# Patient Record
Sex: Male | Born: 1990 | Race: Black or African American | Hispanic: No | Marital: Married | Smoking: Former smoker
Health system: Southern US, Community
[De-identification: ages and names within clinical notes are randomized; demographics above are authoritative.]

## PROBLEM LIST (undated history)

## (undated) DIAGNOSIS — B009 Herpesviral infection, unspecified: Secondary | ICD-10-CM

## (undated) DIAGNOSIS — J45909 Unspecified asthma, uncomplicated: Secondary | ICD-10-CM

## (undated) DIAGNOSIS — G43909 Migraine, unspecified, not intractable, without status migrainosus: Secondary | ICD-10-CM

## (undated) HISTORY — PX: UMBILICAL HERNIA REPAIR: SHX196

## (undated) HISTORY — DX: Migraine, unspecified, not intractable, without status migrainosus: G43.909

## (undated) HISTORY — DX: Herpesviral infection, unspecified: B00.9

---

## 2016-09-19 ENCOUNTER — Emergency Department (HOSPITAL_COMMUNITY): Payer: Self-pay

## 2016-09-19 ENCOUNTER — Emergency Department (HOSPITAL_COMMUNITY)
Admission: EM | Admit: 2016-09-19 | Discharge: 2016-09-19 | Disposition: A | Discharge status: Home / Self Care | Payer: Self-pay | Attending: Emergency Medicine | Admitting: Emergency Medicine

## 2016-09-19 ENCOUNTER — Encounter (HOSPITAL_COMMUNITY): Payer: Self-pay | Admitting: *Deleted

## 2016-09-19 DIAGNOSIS — Z87891 Personal history of nicotine dependence: Secondary | ICD-10-CM | POA: Insufficient documentation

## 2016-09-19 DIAGNOSIS — S92354A Nondisplaced fracture of fifth metatarsal bone, right foot, initial encounter for closed fracture: Secondary | ICD-10-CM | POA: Insufficient documentation

## 2016-09-19 DIAGNOSIS — Y9367 Activity, basketball: Secondary | ICD-10-CM | POA: Insufficient documentation

## 2016-09-19 DIAGNOSIS — Y998 Other external cause status: Secondary | ICD-10-CM | POA: Insufficient documentation

## 2016-09-19 DIAGNOSIS — W1839XA Other fall on same level, initial encounter: Secondary | ICD-10-CM | POA: Insufficient documentation

## 2016-09-19 DIAGNOSIS — Y929 Unspecified place or not applicable: Secondary | ICD-10-CM | POA: Insufficient documentation

## 2016-09-19 MED ORDER — HYDROCODONE-ACETAMINOPHEN 5-325 MG PO TABS
2.0000 | ORAL_TABLET | Freq: Once | ORAL | Status: AC
  Administered 2016-09-19: 2 via ORAL
  Filled 2016-09-19: qty 2

## 2016-09-19 MED ORDER — HYDROCODONE-ACETAMINOPHEN 5-325 MG PO TABS: 1.0000 | ORAL_TABLET | Freq: Four times a day (QID) | ORAL | 0 refills | Status: DC | PRN

## 2016-09-19 NOTE — ED Triage Notes (Signed)
Patient states he was playing basketball earlier yest and rolled his right ankle,c/o pain in ankle worse when walking.

## 2016-09-19 NOTE — ED Provider Notes (Signed)
TIME SEEN: 6:00 AM  CHIEF COMPLAINT: Right foot and ankle pain  HPI: Patient is a 26 year old male with no significant past medical history who presents to the emergency department with right foot and ankle pain. Reports yesterday evening he was playing basketball and he stepped on another players foot rolling his ankle. Complaining of right lateral foot pain. Has been able to "hop around" but not able to bear weight. Denies numbness, tingling or focal weakness. Did not fall or hit his head. No neck or back pain.  ROS: See HPI Constitutional: no fever  Eyes: no drainage  ENT: no runny nose   Cardiovascular:  no chest pain  Resp: no SOB  GI: no vomiting GU: no dysuria Integumentary: no rash  Allergy: no hives  Musculoskeletal: no leg swelling  Neurological: no slurred speech ROS otherwise negative  PAST MEDICAL HISTORY/PAST SURGICAL HISTORY:  History reviewed. No pertinent past medical history.  MEDICATIONS:  Prior to Admission medications   Not on File    ALLERGIES:  Allergies not on file  SOCIAL HISTORY:  Social History  Substance Use Topics  . Smoking status: Former Games developer  . Smokeless tobacco: Never Used  . Alcohol use Yes    FAMILY HISTORY: History reviewed. No pertinent family history.  EXAM: BP 131/78 (BP Location: Left Arm)   Pulse 68   Temp 97.9 F (36.6 C) (Oral)   Resp 22   Ht 5\' 6"  (1.676 m)   Wt 155 lb (70.3 kg)   SpO2 99%   BMI 25.02 kg/m  CONSTITUTIONAL: Alert and oriented and responds appropriately to questions. Well-appearing; well-nourished HEAD: Normocephalic, Atraumatic EYES: Conjunctivae clear, PERRL, EOMI ENT: normal nose; no rhinorrhea; moist mucous membranes NECK: Supple, no meningismus, no nuchal rigidity, no LAD  CARD: RRR; S1 and S2 appreciated; no murmurs, no clicks, no rubs, no gallops RESP: Normal chest excursion without splinting or tachypnea; breath sounds clear and equal bilaterally; no wheezes, no rhonchi, no rales, no  hypoxia or respiratory distress, speaking full sentences ABD/GI: Normal bowel sounds; non-distended; soft, non-tender, no rebound, no guarding, no peritoneal signs, no hepatosplenomegaly BACK:  The back appears normal and is non-tender to palpation, there is no CVA tenderness EXT: Tender to palpation over the right lateral foot with associated ecchymosis and swelling. No ligamentous laxity of the right ankle. He does have some very minimal tenderness over the right lateral malleolus. Compartments are soft. No tenderness at the proximal fibular head on the right side. 2+ DP pulses bilaterally. Sensation to light touch intact diffusely throughout the right foot. Normal ROM in all joints; otherwise extremities are non-tender to palpation; no edema; normal capillary refill; no cyanosis, no calf tenderness or swelling    SKIN: Normal color for age and race; warm; no rash NEURO: Moves all extremities equally, sensation to light touch intact diffusely, cranial nerves II through XII intact, normal speech PSYCH: The patient's mood and manner are appropriate. Grooming and personal hygiene are appropriate.  MEDICAL DECISION MAKING: Patient here with mechanical fall. Has a right fifth metatarsal nondisplaced fracture. We will place a short leg splint and keep him crutches to keep him nonweightbearing. We'll discharge with prescription for Vicodin, instructions for splint care, ice, elevation and orthopedic follow-up. No other injury on exam. Neurovascularly intact distally. He verbalizes understanding and is comfortable with this plan.    At this time, I do not feel there is any life-threatening condition present. I have reviewed and discussed all results (EKG, imaging, lab, urine as appropriate) and  exam findings with patient/family. I have reviewed nursing notes and appropriate previous records.  I feel the patient is safe to be discharged home without further emergent workup and can continue workup as an  outpatient as needed. Discussed usual and customary return precautions. Patient/family verbalize understanding and are comfortable with this plan.  Outpatient follow-up has been provided. All questions have been answered.      Layla MawKristen N Zedekiah Hinderman, DO 09/19/16 [phone]

## 2017-07-23 ENCOUNTER — Other Ambulatory Visit: Payer: Self-pay

## 2017-07-23 ENCOUNTER — Emergency Department (HOSPITAL_COMMUNITY)
Admission: EM | Admit: 2017-07-23 | Discharge: 2017-07-23 | Disposition: A | Discharge status: Home / Self Care | Payer: No Typology Code available for payment source | Attending: Emergency Medicine | Admitting: Emergency Medicine

## 2017-07-23 ENCOUNTER — Encounter (HOSPITAL_COMMUNITY): Payer: Self-pay | Admitting: *Deleted

## 2017-07-23 DIAGNOSIS — S199XXA Unspecified injury of neck, initial encounter: Secondary | ICD-10-CM | POA: Diagnosis present

## 2017-07-23 DIAGNOSIS — M542 Cervicalgia: Secondary | ICD-10-CM | POA: Diagnosis not present

## 2017-07-23 DIAGNOSIS — Y999 Unspecified external cause status: Secondary | ICD-10-CM | POA: Diagnosis not present

## 2017-07-23 DIAGNOSIS — M62838 Other muscle spasm: Secondary | ICD-10-CM | POA: Diagnosis not present

## 2017-07-23 DIAGNOSIS — J45909 Unspecified asthma, uncomplicated: Secondary | ICD-10-CM | POA: Insufficient documentation

## 2017-07-23 DIAGNOSIS — Z87891 Personal history of nicotine dependence: Secondary | ICD-10-CM | POA: Insufficient documentation

## 2017-07-23 DIAGNOSIS — X500XXA Overexertion from strenuous movement or load, initial encounter: Secondary | ICD-10-CM | POA: Insufficient documentation

## 2017-07-23 DIAGNOSIS — Y93B9 Activity, other involving muscle strengthening exercises: Secondary | ICD-10-CM | POA: Insufficient documentation

## 2017-07-23 DIAGNOSIS — Y9239 Other specified sports and athletic area as the place of occurrence of the external cause: Secondary | ICD-10-CM | POA: Insufficient documentation

## 2017-07-23 HISTORY — DX: Unspecified asthma, uncomplicated: J45.909

## 2017-07-23 MED ORDER — METHOCARBAMOL 750 MG PO TABS
750.0000 mg | ORAL_TABLET | Freq: Three times a day (TID) | ORAL | 0 refills | Status: DC | PRN
Start: 2017-07-23 — End: 2020-10-19

## 2017-07-23 NOTE — Discharge Instructions (Signed)
Please take Ibuprofen (Advil, motrin) and Tylenol (acetaminophen) to relieve your pain.  You may take up to 600 MG (3 pills) of normal strength ibuprofen every 8 hours as needed.  In between doses of ibuprofen you make take tylenol, up to 1,000 mg (two extra strength pills).  Do not take more than 3,000 mg tylenol in a 24 hour period.  Please check all medication labels as many medications such as pain and cold medications may contain tylenol.  Do not drink alcohol while taking these medications.  Do not take other NSAID'S while taking ibuprofen (such as aleve or naproxen).  Please take ibuprofen with food to decrease stomach upset. ° °The best way to get rid of muscle pain is by taking NSAIDS, using heat, massage therapy, and gentle stretching/range of motion exercises. ° °You are being prescribed a medication which may make you sleepy. For 24 hours after one dose please do not drive, operate heavy machinery, care for a small child with out another adult present, or perform any activities that may cause harm to you or someone else if you were to fall asleep or be impaired.  ° °

## 2017-07-23 NOTE — ED Triage Notes (Signed)
thwe pt is c/o neck pain for 3 days he thinks he  May have strined it at the gym  Where he goes 4-5 times a week

## 2017-07-23 NOTE — ED Provider Notes (Signed)
[facility] EMERGENCY DEPARTMENT Provider Note   CSN: [id number] Arrival date & time: 07/23/17  1454     History   Chief Complaint Chief Complaint  Patient presents with  . Neck Injury    HPI Douglas Ellis is a 27 y.o. male who presents today for evaluation of right-sided neck pain for approximately 3 days.  He reports that he goes to the gym 4-5 times a week and, while he does not recall doing any new exercises, suspects that he may have strained the muscle at the gym.  He denies any trauma.  No fevers or chills.  He denies any numbness or tingling.  Pain is made worse with movement, unchanged by ibuprofen, feels cramping in nature. HPI  Past Medical History:  Diagnosis Date  . Asthma     There are no active problems to display for this patient.   History reviewed. No pertinent surgical history.     Home Medications    Prior to Admission medications   Medication Sig Start Date End Date Taking? Authorizing Provider  HYDROcodone-acetaminophen (NORCO/VICODIN) 5-325 MG tablet Take 1-2 tablets by mouth every 6 (six) hours as needed. 09/19/16   Ward, Layla Maw, DO  methocarbamol (ROBAXIN) 750 MG tablet Take 1-2 tablets (750-1,500 mg total) by mouth 3 (three) times daily as needed for muscle spasms. 07/23/17   Cristina Gong, PA-C    Family History No family history on file.  Social History Social History   Tobacco Use  . Smoking status: Former Games developer  . Smokeless tobacco: Never Used  Substance Use Topics  . Alcohol use: Yes  . Drug use: Yes    Types: Marijuana     Allergies   Patient has no known allergies.   Review of Systems Review of Systems  Constitutional: Negative for chills and fever.  Musculoskeletal: Positive for neck pain.       Muscle pain in right posterior shoulder, lateral neck.   Skin: Negative for color change and wound.  Neurological: Negative for weakness, numbness and headaches.     Physical Exam Updated Vital  Signs BP 127/70 (BP Location: Right Arm)   Pulse 60   Temp 98.5 F (36.9 C) (Oral)   Resp 16   Ht 5\' 6"  (1.676 m)   Wt 72.6 kg (160 lb)   SpO2 100%   BMI 25.82 kg/m   Physical Exam  Constitutional: He appears well-developed and well-nourished.  HENT:  Head: Normocephalic and atraumatic.  Cardiovascular: Normal rate, regular rhythm, normal heart sounds and intact distal pulses.  2+ radial pulses bilaterally.  Pulmonary/Chest: Effort normal and breath sounds normal. No respiratory distress.  Musculoskeletal:  5/5 grip strength bilaterally.  No midline C/T-spine tenderness.  There is mild right sided lateral neck paraspinal muscle TTP.  Palpation of trapezius muscle both recreates and exacerbates his pain.    Neurological:  Sensation intact and symmetrical to bilateral upper extremities.  Skin: Skin is warm and dry. He is not diaphoretic.  Psychiatric: He has a normal mood and affect. His behavior is normal.  Nursing note and vitals reviewed.    ED Treatments / Results  Labs (all labs ordered are listed, but only abnormal results are displayed) Labs Reviewed - No data to display  EKG  EKG Interpretation None       Radiology No results found.  Procedures Procedures (including critical care time)  Medications Ordered in ED Medications - No data to display   Initial Impression / Assessment and Plan /  ED Course  I have reviewed the triage vital signs and the nursing notes.  Pertinent labs & imaging results that were available during my care of the patient were reviewed by me and considered in my medical decision making (see chart for details).    [facility]enberg Presents with right-sided neck paraspinal tenderness and right-sided superior posterior shoulder tenderness consistent with trapezius muscle spasm.  He does not have any history of trauma, and palpation along trapezius muscle both re-creates and exacerbates his reported pain.  Patient given instructions for  OTC pain medication, muscle relaxer.  He was advised that the prescribed medication may make him sleepy or drowsy and he should not drive, operate heavy machinery, or perform other potentially dangerous tasks while taking this medicine.  Patient advised to follow up with PCP if symptoms persist for longer than one week.  Patient was given the option to ask questions, all of which were answered to the best of my ability.  Patient is agreeable for discharge.     Final Clinical Impressions(s) / ED Diagnoses   Final diagnoses:  Trapezius muscle spasm  Neck pain    ED Discharge Orders        Ordered    methocarbamol (ROBAXIN) 750 MG tablet  3 times daily PRN     07/23/17 2029       Cristina GongHammond, Emmalia Heyboer W, PA-C 07/24/17 0057    Nira Connardama, Pedro Eduardo, MD 07/27/17 0157

## 2018-08-19 IMAGING — CR DG ANKLE COMPLETE 3+V*R*
3 series · 3 of 3 positions shown · non-contrast
Comparison: None.

CLINICAL DATA: Twisted ankle yesterday while playing basketball.
Predominately lateral foot and to lesser extent ankle pain.

EXAM:
RIGHT ANKLE - COMPLETE 3+ VIEW; RIGHT FOOT COMPLETE - 3+ VIEW

[ankle ap]
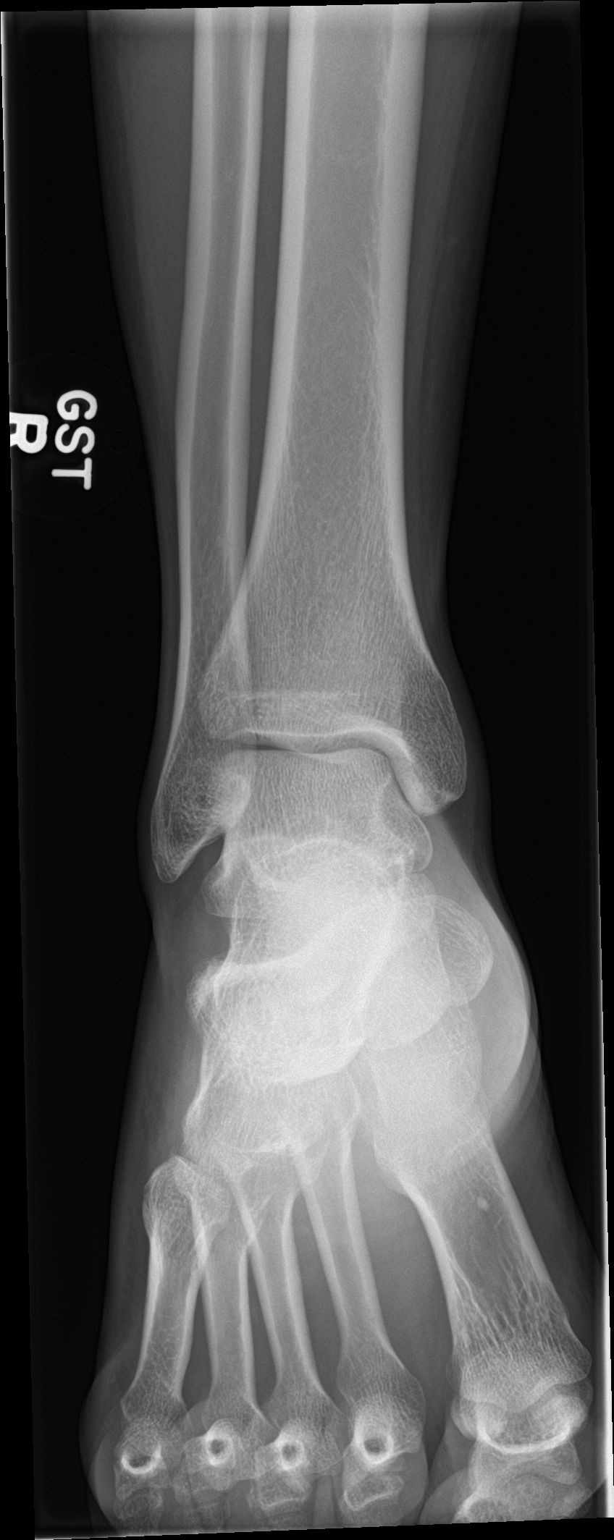

[ankle obl]
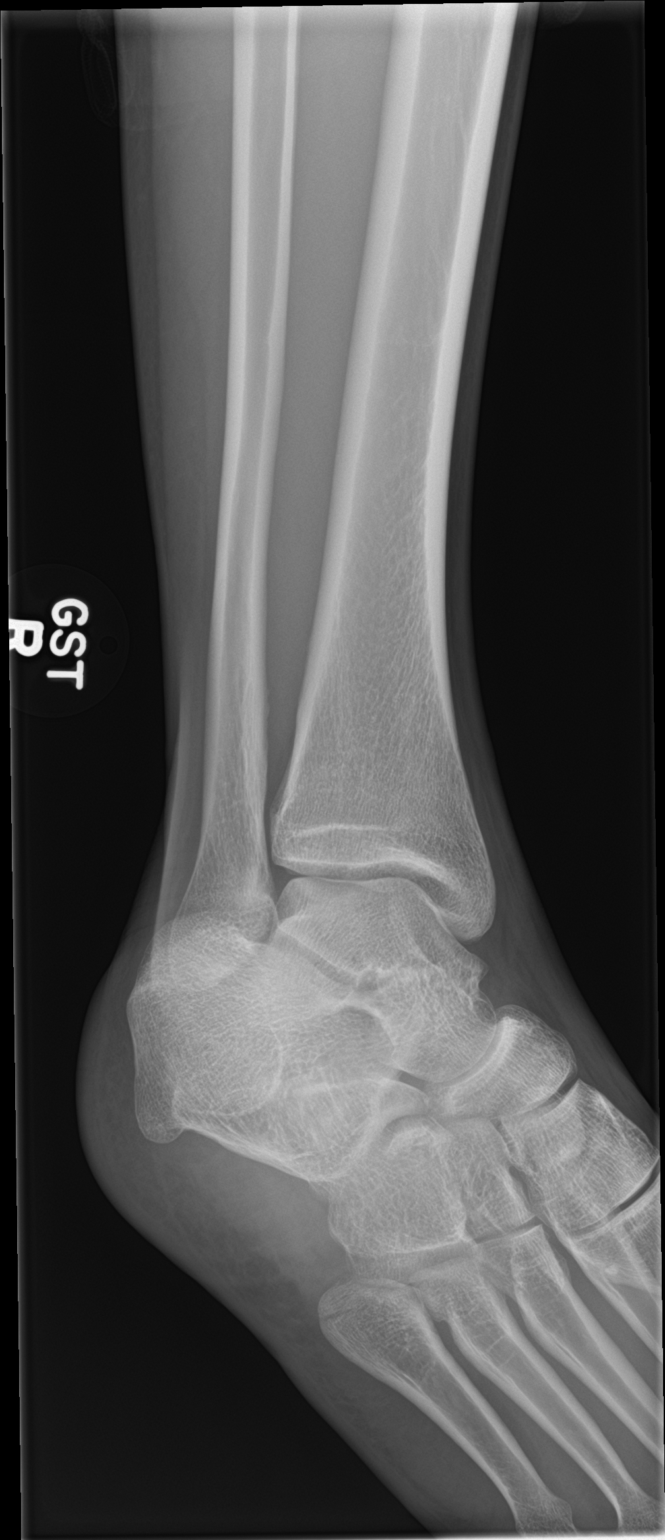

[ankle lat]
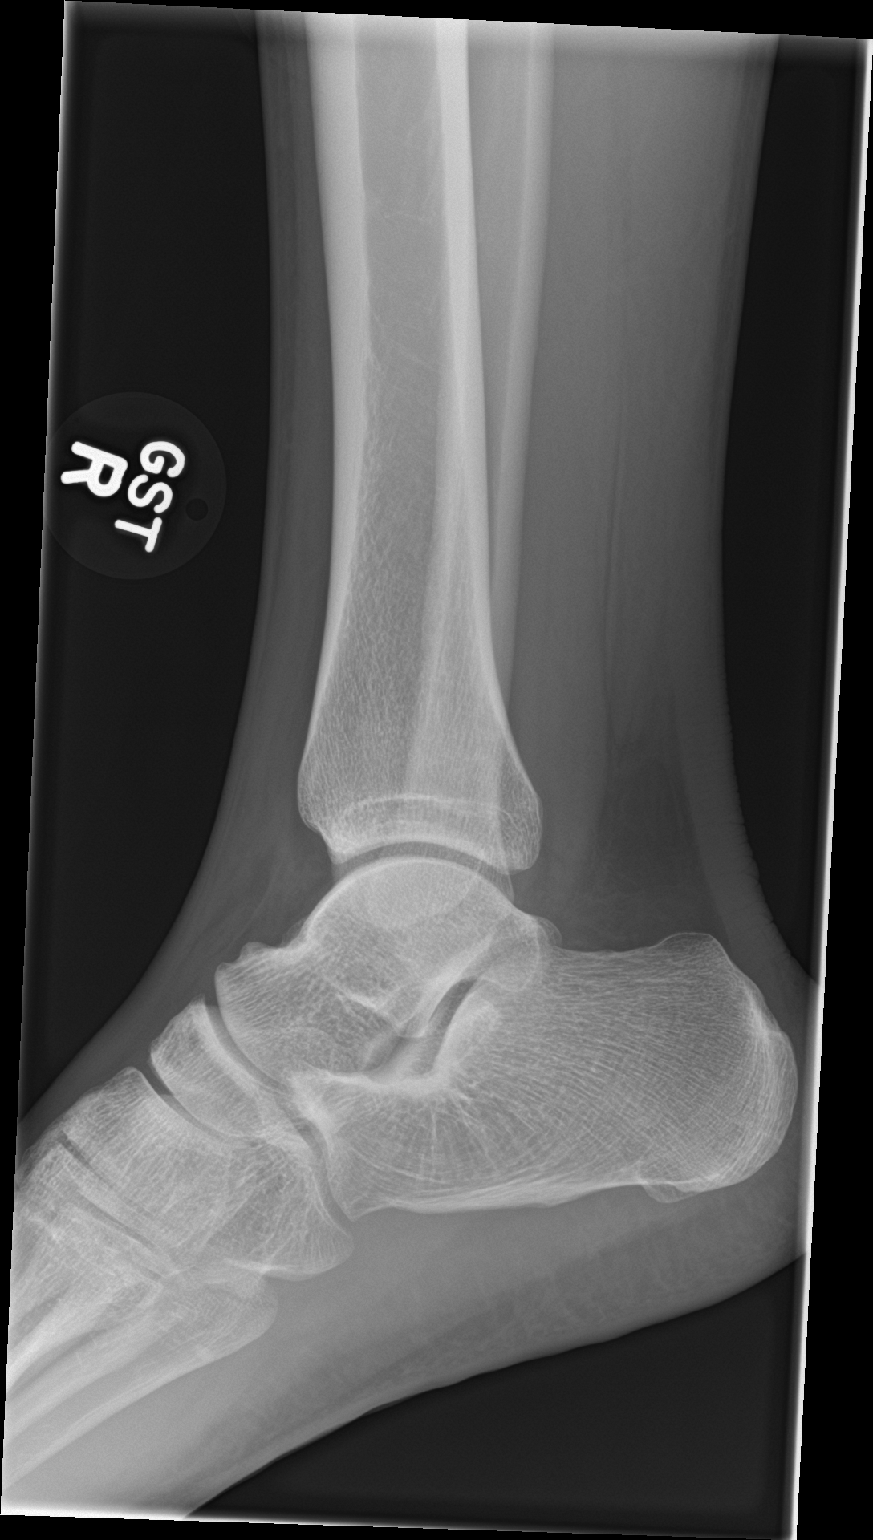

[3 of 3 positions shown; findings below may reference images not displayed]

FINDINGS: RIGHT foot: Faint linear lucency through base of fifth metatarsus
without distraction or offset. No dislocation. Bipartite first
metatarsal tibial sesamoid. Joint spaces intact without erosions. No
destructive bony lesions. Soft tissue planes are not suspicious.

RIGHT ankle: No fracture deformity nor dislocation. The ankle
mortise appears congruent and the tibiofibular syndesmosis intact.
No destructive bony lesions. Soft tissue planes are non-suspicious.
IMPRESSION: RIGHT foot: Acute nondisplaced base of fifth metatarsus fracture. No
dislocation.

RIGHT ankle: Negative.

## 2018-09-25 ENCOUNTER — Emergency Department (HOSPITAL_COMMUNITY)
Admission: EM | Admit: 2018-09-25 | Discharge: 2018-09-25 | Disposition: A | Discharge status: Home / Self Care | Payer: No Typology Code available for payment source | Attending: Emergency Medicine | Admitting: Emergency Medicine

## 2018-09-25 ENCOUNTER — Encounter (HOSPITAL_COMMUNITY): Payer: Self-pay | Admitting: *Deleted

## 2018-09-25 DIAGNOSIS — Z87891 Personal history of nicotine dependence: Secondary | ICD-10-CM | POA: Diagnosis not present

## 2018-09-25 DIAGNOSIS — R300 Dysuria: Secondary | ICD-10-CM | POA: Diagnosis present

## 2018-09-25 DIAGNOSIS — J45909 Unspecified asthma, uncomplicated: Secondary | ICD-10-CM | POA: Diagnosis not present

## 2018-09-25 DIAGNOSIS — Z79899 Other long term (current) drug therapy: Secondary | ICD-10-CM | POA: Insufficient documentation

## 2018-09-25 DIAGNOSIS — R590 Localized enlarged lymph nodes: Secondary | ICD-10-CM | POA: Insufficient documentation

## 2018-09-25 LAB — URINALYSIS, ROUTINE W REFLEX MICROSCOPIC
BACTERIA UA: NONE SEEN
Bilirubin Urine: NEGATIVE
Glucose, UA: NEGATIVE mg/dL
Ketones, ur: 20 mg/dL — AB
LEUKOCYTE UA: NEGATIVE
NITRITE: NEGATIVE
Protein, ur: NEGATIVE mg/dL
SPECIFIC GRAVITY, URINE: 1.028 (ref 1.005–1.030)
pH: 5 (ref 5.0–8.0)

## 2018-09-25 MED ORDER — VALACYCLOVIR HCL 1 G PO TABS: 1000.0000 mg | ORAL_TABLET | Freq: Three times a day (TID) | ORAL | 0 refills | Status: AC

## 2018-09-25 NOTE — Discharge Instructions (Signed)
Take Valtrex as prescribed.  Take the entire course, even if your symptoms improve. Use Tylenol or ibuprofen as needed for pain. If your symptoms are not improving, follow-up with urology. Return to the emergency room with any new, worsening, concerning symptoms.

## 2018-09-25 NOTE — ED Provider Notes (Signed)
[facility]-EMERGENCY DEPT Provider Note   CSN: [id number] Arrival date & time: 09/25/18  1515    History   Chief Complaint Chief Complaint  Patient presents with  . Dysuria  . Groin Pain    HPI Douglas Ellis is a 28 y.o. male presenting for evaluation of urinary discomfort and groin pain.  Patient states for the past 4 to 5 days, while he has been urinating he is having pain.  Pain is on the ventral aspect of his penile shaft.  Patient denies history of the same.  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  He states earlier this week, he was having stomach upset including nausea and weakness, but this has been resolving.  He is currently tolerating p.o. without difficulty.  He denies abdominal pain.  He has fevers, chills, chest pain, shortness of breath, abnormal bowel movements.  He denies hematuria, urinary frequency, or decreased urination.  He denies known lesion.  He denies penile discharge.  He does not want to be tested for STDs today.  Additionally, patient reports left groin discomfort.  He has not taken anything for this including Tylenol or ibuprofen.  Patient reports a history of asthma, no other medical problems. Takes no medications daily. He is sexually active with 1 male partner who is sx-free.      HPI  Past Medical History:  Diagnosis Date  . Asthma     There are no active problems to display for this patient.   History reviewed. No pertinent surgical history.      Home Medications    Prior to Admission medications   Medication Sig Start Date End Date Taking? Authorizing Provider  HYDROcodone-acetaminophen (NORCO/VICODIN) 5-325 MG tablet Take 1-2 tablets by mouth every 6 (six) hours as needed. 09/19/16   Ward, Layla Maw, DO  methocarbamol (ROBAXIN) 750 MG tablet Take 1-2 tablets (750-1,500 mg total) by mouth 3 (three) times daily as needed for muscle  spasms. 07/23/17   Cristina Gong, PA-C  valACYclovir (VALTREX) 1000 MG tablet Take 1 tablet (1,000 mg total) by mouth 3 (three) times daily for 7 days. 09/25/18 10/02/18  Ygnacio Fecteau, PA-C    Family History No family history on file.  Social History Social History   Tobacco Use  . Smoking status: Former Games developer  . Smokeless tobacco: Never Used  Substance Use Topics  . Alcohol use: Yes  . Drug use: Yes    Types: Marijuana     Allergies   Patient has no known allergies.   Review of Systems Review of Systems  Genitourinary: Positive for penile pain. Negative for decreased urine volume, difficulty urinating, frequency, genital sores, hematuria, penile swelling, scrotal swelling and testicular pain.     Physical Exam Updated Vital Signs BP (!) 145/69 (BP Location: Left Arm)   Pulse 73   Temp 98.9 F (37.2 C) (Oral)   Resp 18   SpO2 100%   Physical Exam Vitals signs and nursing note reviewed. Exam conducted with a chaperone present.  Constitutional:      General: He is not in acute distress.    Appearance: He is well-developed.  HENT:     Head: Normocephalic and atraumatic.  Neck:     Musculoskeletal: Normal range of motion.  Pulmonary:     Effort: Pulmonary effort is normal.  Abdominal:     General: There is no distension.  Palpations: Abdomen is soft. There is no mass.     Tenderness: There is no abdominal tenderness. There is no guarding or rebound.     Comments: No ttp of the abd  Genitourinary:    Penis: Circumcised.        Comments: Mild swellind of ventral penile shaft just proximal to the glans. No erythema or warmth. No penile drainage. No testicular pain or swelling.  No lesions noted Musculoskeletal: Normal range of motion.  Lymphadenopathy:     Lower Body: Left inguinal adenopathy present.  Skin:    General: Skin is warm.     Capillary Refill: Capillary refill takes less than 2 seconds.     Findings: No rash.  Neurological:      Mental Status: He is alert and oriented to person, place, and time.      ED Treatments / Results  Labs (all labs ordered are listed, but only abnormal results are displayed) Labs Reviewed  URINALYSIS, ROUTINE W REFLEX MICROSCOPIC - Abnormal; Notable for the following components:      Result Value   Hgb urine dipstick SMALL (*)    Ketones, ur 20 (*)    All other components within normal limits    EKG None  Radiology No results found.  Procedures Procedures (including critical care time)  Medications Ordered in ED Medications - No data to display   Initial Impression / Assessment and Plan / ED Course  I have reviewed the triage vital signs and the nursing notes.  Pertinent labs & imaging results that were available during my care of the patient were reviewed by me and considered in my medical decision making (see chart for details).        Patient presenting for evaluation of discomfort with urination and groin pain.  Physical exam shows patient is afebrile not tachycardic.  Appears nontoxic.  On exam, no obvious bacterial infection.  No penile discharge.  Minimal swelling and tenderness of the ventral shaft of the penis.  Minimal left-sided inguinal lymphadenopathy and tenderness.  Offered STD testing, patient declined.  No visible lesions to suggest HSV.  Case discussed with attending, Dr. Rodena Medin agrees to plan.  Consider early HSV in which the lesions have not shown yet.  Consider traumatic cause for patient's pain.  Will have patient abstain from sex until symptoms resolve, and give course of Valtrex in case this is early HSV.  Urine without infection.  Encourage patient follow-up with urology if symptoms not improving.  At this time, patient appears safe for discharge.  Return precautions given.  Patient states he understands and agrees to plan.   Final Clinical Impressions(s) / ED Diagnoses   Final diagnoses:  Dysuria  LAD (lymphadenopathy), inguinal    ED  Discharge Orders         Ordered    valACYclovir (VALTREX) 1000 MG tablet  3 times daily     09/25/18 1708           [place], [place], PA-C 09/25/18 1742    Wynetta Fines, MD 09/25/18 2025

## 2018-09-25 NOTE — ED Triage Notes (Signed)
Pt complains of painful urination for the past few days. Pt states he has painful lump in left groin area.

## 2019-10-14 ENCOUNTER — Emergency Department (HOSPITAL_COMMUNITY)
Admission: EM | Admit: 2019-10-14 | Discharge: 2019-10-14 | Disposition: A | Discharge status: Home / Self Care | Payer: BC Managed Care – PPO | Attending: Emergency Medicine | Admitting: Emergency Medicine

## 2019-10-14 ENCOUNTER — Encounter (HOSPITAL_COMMUNITY): Payer: Self-pay

## 2019-10-14 ENCOUNTER — Other Ambulatory Visit: Payer: Self-pay

## 2019-10-14 DIAGNOSIS — Z87891 Personal history of nicotine dependence: Secondary | ICD-10-CM | POA: Diagnosis not present

## 2019-10-14 DIAGNOSIS — J45909 Unspecified asthma, uncomplicated: Secondary | ICD-10-CM | POA: Insufficient documentation

## 2019-10-14 DIAGNOSIS — R319 Hematuria, unspecified: Secondary | ICD-10-CM | POA: Diagnosis present

## 2019-10-14 LAB — URINALYSIS, ROUTINE W REFLEX MICROSCOPIC
Bilirubin Urine: NEGATIVE
Glucose, UA: NEGATIVE mg/dL
Hgb urine dipstick: NEGATIVE
Ketones, ur: NEGATIVE mg/dL
Leukocytes,Ua: NEGATIVE
Nitrite: NEGATIVE
Protein, ur: NEGATIVE mg/dL
Specific Gravity, Urine: 1.023 (ref 1.005–1.030)
pH: 7 (ref 5.0–8.0)

## 2019-10-14 LAB — CBG MONITORING, ED: Glucose-Capillary: 81 mg/dL (ref 70–99)

## 2019-10-14 NOTE — Discharge Instructions (Addendum)
You were evaluated in the Emergency Department and after careful evaluation, we did not find any emergent condition requiring admission or further testing in the hospital.  Your exam/testing today is overall reassuring.  If you have further episodes of blood in the urine we recommend follow-up with the urologist.  We also recommend follow-up with primary care doctor.  Please return to the Emergency Department if you experience any worsening of your condition.  We encourage you to follow up with a primary care provider.  Thank you for allowing [place] to be a part of your care.

## 2019-10-14 NOTE — ED Provider Notes (Signed)
[facility]-EMERGENCY DEPT [facility] Emergency Department Provider Note MRN:  [id number]  Arrival date & time: 10/14/19     Chief Complaint   Hematuria   History of Present Illness   Douglas Ellis is a 29 y.o. year-old male with a history of asthma presenting to the ED with chief complaint of hematuria.  Noticed blood in the urine this Ellis, Douglas here for evaluation.  Some burning during this urination.  Denies concern for STDs, no testicular or scrotal pain, no trauma, no fever, this is never happened before, no back or flank pain.  Also endorsing 2 or 3 months of intermittent tingling sensations to the fingers of both hands, seems to happen more frequently at night.  No numbness or weakness, no headache, no other complaints.  Review of Systems  A complete 10 system review of systems was obtained and all systems are negative except as noted in the HPI and PMH.   Patient's Health History    Past Medical History:  Diagnosis Date  . Asthma     History reviewed. No pertinent surgical history.  Family History  Problem Relation Age of Onset  . Diabetes Mother   . Psoriasis Father     Social History   Socioeconomic History  . Marital status: Single    Spouse name: Not on file  . Number of children: Not on file  . Years of education: Not on file  . Highest education level: Not on file  Occupational History  . Not on file  Tobacco Use  . Smoking status: Former Smoker    Types: Cigarettes  . Smokeless tobacco: Never Used  Substance and Sexual Activity  . Alcohol use: Yes  . Drug use: Yes    Types: Marijuana  . Sexual activity: Not on file  Other Topics Concern  . Not on file  Social History Narrative  . Not on file   Social Determinants of Health   Financial Resource Strain:   . Difficulty of Paying Living Expenses:   Food Insecurity:   . Worried About Programme researcher, broadcasting/film/video in the Last Year:   . Barista in the Last Year:   Transportation Needs:   .  Freight forwarder (Medical):   Marland Kitchen Lack of Transportation (Non-Medical):   Physical Activity:   . Days of Exercise per Week:   . Minutes of Exercise per Session:   Stress:   . Feeling of Stress :   Social Connections:   . Frequency of Communication with Friends and Family:   . Frequency of Social Gatherings with Friends and Family:   . Attends Religious Services:   . Active Member of Clubs or Organizations:   . Attends Banker Meetings:   Marland Kitchen Marital Status:   Intimate Partner Violence:   . Fear of Current or Ex-Partner:   . Emotionally Abused:   Marland Kitchen Physically Abused:   . Sexually Abused:      Physical Exam   Vitals:   10/14/19 1249  BP: 118/82  Pulse: (!) 58  Resp: 16  Temp: 98.3 F (36.8 C)  SpO2: 100%    CONSTITUTIONAL: Well-appearing, NAD NEURO:  Alert and oriented x 3, no focal deficits EYES:  eyes equal and reactive ENT/NECK:  no LAD, no JVD CARDIO: Regular rate, well-perfused, normal S1 and S2 PULM:  CTAB no wheezing or rhonchi GI/GU:  normal bowel sounds, non-distended, non-tender MSK/SPINE:  No gross deformities, no edema SKIN:  no rash, atraumatic PSYCH:  Appropriate speech  and behavior  *Additional and/or pertinent findings included in MDM below  Diagnostic and Interventional Summary    EKG Interpretation  Date/Time:    Ventricular Rate:    PR Interval:    QRS Duration:   QT Interval:    QTC Calculation:   R Axis:     Text Interpretation:        Labs Reviewed  URINALYSIS, ROUTINE W REFLEX MICROSCOPIC  CBG MONITORING, ED    No orders to display    Medications - No data to display   Procedures  /  Critical Care Procedures  ED Course and Medical Decision Making  I have reviewed the triage vital signs, the nursing notes, and pertinent available records from the EMR.  Pertinent labs & imaging results that were available during my care of the patient were reviewed by me and considered in my medical decision making (see below  for details).     Will obtain urinalysis to evaluate for urinary tract infection.  If no infection but gross blood, will refer to urology, no flank pain to suggest stone, well-appearing, normal vital signs.  Will screen with blood sugar to evaluate for the paresthesias to the fingers that is intermittent but this is a chronic condition that is best served with PCP follow-up.  Normal neurological exam here today.   Barth Kirks. Sedonia Small, [place] [email or web address]  Final Clinical Impressions(s) / ED Diagnoses     ICD-10-CM   1. Hematuria, unspecified type  R31.9     ED Discharge Orders    None       Discharge Instructions Discussed with and Provided to Patient:     Discharge Instructions     You were evaluated in the Emergency Department and after careful evaluation, we did not find any emergent condition requiring admission or further testing in the hospital.  Your exam/testing today is overall reassuring.  If you have further episodes of blood in the urine we recommend follow-up with the urologist.  We also recommend follow-up with primary care doctor.  Please return to the Emergency Department if you experience any worsening of your condition.  We encourage you to follow up with a primary care provider.  Thank you for allowing [place] to be a part of your care.       Maudie Flakes, MD 10/14/19 1520

## 2019-10-14 NOTE — ED Triage Notes (Signed)
Patient that he noticed blood in his urine this AM.  Patient also c/o intermittent numbness of his fingers. Patient states he does a lot of heavy lifting at his job.

## 2020-10-19 ENCOUNTER — Ambulatory Visit (INDEPENDENT_AMBULATORY_CARE_PROVIDER_SITE_OTHER): Payer: 59 | Admitting: Medical

## 2020-10-19 ENCOUNTER — Other Ambulatory Visit: Payer: Self-pay

## 2020-10-19 ENCOUNTER — Encounter: Payer: Self-pay | Admitting: Medical

## 2020-10-19 VITALS — BP 128/80 | HR 62 | Ht 66.0 in | Wt 174.0 lb

## 2020-10-19 DIAGNOSIS — Z8249 Family history of ischemic heart disease and other diseases of the circulatory system: Secondary | ICD-10-CM | POA: Insufficient documentation

## 2020-10-19 DIAGNOSIS — Z7185 Encounter for immunization safety counseling: Secondary | ICD-10-CM

## 2020-10-19 DIAGNOSIS — Z0000 Encounter for general adult medical examination without abnormal findings: Secondary | ICD-10-CM | POA: Diagnosis not present

## 2020-10-19 DIAGNOSIS — Z13220 Encounter for screening for lipoid disorders: Secondary | ICD-10-CM

## 2020-10-19 DIAGNOSIS — J45909 Unspecified asthma, uncomplicated: Secondary | ICD-10-CM | POA: Diagnosis not present

## 2020-10-19 DIAGNOSIS — G43909 Migraine, unspecified, not intractable, without status migrainosus: Secondary | ICD-10-CM

## 2020-10-19 NOTE — Progress Notes (Signed)
Subjective:   HPI  Douglas Ellis is a 30 y.o. male who presents for Chief Complaint  Patient presents with  . New Patient (Initial Visit)    Pt present to establish care.   . Annual Exam    Patient Care Team: Lilliona Blakeney, Cleda Mccreedy as PCP - General (Family Medicine) Sees dentist Sees eye doctor  Concerns: New patient today to establish care.  Here for a physical.  Originally from [place].  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  He thinks he has had HPV vaccine in the past.  Declines COVID vaccine.  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  No recurrence.  Married  Reviewed their medical, surgical, family, social, medication, and allergy history and updated chart as appropriate.  Past Medical History:  Diagnosis Date  . Asthma   . Herpes simplex   . Migraine    diagnosed age12    Past Surgical History:  Procedure Laterality Date  . UMBILICAL HERNIA REPAIR     childhood    Family History  Problem Relation Age of Onset  . Diabetes Mother   . Hypertension Mother   . Asthma Mother   . Psoriasis Father   . Asthma Brother   . Heart disease Brother        enlarged heart, pacemaker  . Sleep apnea Brother   . Diabetes Maternal Grandmother   . Asthma Maternal Grandmother   . Diabetes Maternal Grandfather   . Asthma Brother   . Cancer Neg Hx   . Stroke Neg Hx     No current outpatient medications on file.  No Known Allergies     Review of Systems Constitutional: -fever, -chills, -sweats, -unexpected weight change, -decreased appetite, -fatigue Allergy: -sneezing, -itching, -congestion Dermatology: -changing moles, --rash, -lumps ENT: -runny nose, -ear pain, -sore throat, -hoarseness, -sinus pain, -teeth pain, - ringing in ears, -hearing loss, -nosebleeds Cardiology: -chest pain, -palpitations, -swelling, -difficulty breathing when lying flat, -waking up short of breath Respiratory: -cough, -shortness of breath,  -difficulty breathing with exercise or exertion, -wheezing, -coughing up blood Gastroenterology: -abdominal pain, -nausea, -vomiting, -diarrhea, -constipation, -blood in stool, -changes in bowel movement, -difficulty swallowing or eating Hematology: -bleeding, -bruising  Musculoskeletal: -joint aches, -muscle aches, -joint swelling, -back pain, -neck pain, -cramping, -changes in gait Ophthalmology: denies vision changes, eye redness, itching, discharge Urology: -burning with urination, -difficulty urinating, -blood in urine, -urinary frequency, -urgency, -incontinence Neurology: -headache, -weakness, -tingling, -numbness, -memory loss, -falls, -dizziness Psychology: -depressed mood, -agitation, -sleep problems Male GU: no testicular mass, pain, no lymph nodes swollen, no swelling, no rash.     Objective:  BP 128/80   Pulse 62   Ht 5\' 6"  (1.676 m)   Wt 174 lb (78.9 kg)   SpO2 97%   BMI 28.08 kg/m   General appearance: alert, no distress, WD/WN, African American male Skin: No worrisome lesions HEENT: normocephalic, conjunctiva/corneas normal, sclerae anicteric, PERRLA, EOMi, nares patent, no discharge or erythema, pharynx normal Oral cavity: MMM, tongue normal, teeth normal Neck: supple, no lymphadenopathy, no thyromegaly, no masses, normal ROM, no bruits Chest: non tender, normal shape and expansion Heart: RRR, normal S1, S2, no murmurs Lungs: CTA bilaterally, no wheezes, rhonchi, or rales Abdomen: +bs, soft, non tender, non distended, no masses, no hepatomegaly, no splenomegaly, no bruits Back: non tender, normal ROM, no scoliosis Musculoskeletal: upper extremities non tender, no obvious deformity, normal ROM throughout, lower extremities non tender, no obvious deformity,  normal ROM throughout Extremities: no edema, no cyanosis, no clubbing Pulses: 2+ symmetric, upper and lower extremities, normal cap refill Neurological: alert, oriented x 3, CN2-12 intact, strength normal upper  extremities and lower extremities, sensation normal throughout, DTRs 2+ throughout, no cerebellar signs, gait normal Psychiatric: normal affect, behavior normal, pleasant  GU: normal male external genitalia,circumcised, nontender, no masses, no hernia, no lymphadenopathy Rectal: Declined   Assessment and Plan :   Encounter Diagnoses  Name Primary?  . Encounter for health maintenance examination in adult Yes  . Screening for lipid disorders   . Vaccine counseling   . Childhood asthma without complication, unspecified asthma severity, unspecified whether persistent   . Migraine without status migrainosus, not intractable, unspecified migraine type   . Family history of heart disease     Today you had a preventative care visit or wellness visit.    Topics today may have included healthy lifestyle, diet, exercise, preventative care, vaccinations, sick and well care, proper use of emergency dept and after hours care, as well as other concerns.     Recommendations: Continue to return yearly for your annual wellness and preventative care visits.  This gives [place] a chance to discuss healthy lifestyle, exercise, vaccinations, review your chart record, and perform screenings where appropriate.  I recommend you see your eye doctor yearly for routine vision care.  I recommend you see your dentist yearly for routine dental care including hygiene visits twice yearly.   Vaccination recommendations were reviewed  Advise he gets a copy of his vaccine record from [place]  He declines COVID vaccine.  He likely could be due for tetanus vaccine   Screening for cancer: Colon cancer screening:  Age 56  Cancer screening You should do a monthly self testicular exam   We discussed PSA, prostate exam, and prostate cancer screening risks/benefits.   Age 33  Skin cancer screening: Check your skin regularly for new changes, growing lesions, or other lesions of concern Come in for evaluation if  you have skin lesions of concern.  Lung cancer screening: If you have a greater than 30 pack year history of tobacco use, then you qualify for lung cancer screening with a chest CT scan  We currently don't have screenings for other cancers besides breast, cervical, colon, and lung cancers.  If you have a strong family history of cancer or have other cancer screening concerns, please let me know.    Bone health: Get at least 150 minutes of aerobic exercise weekly Get weight bearing exercise at least once weekly   Heart health: Get at least 150 minutes of aerobic exercise weekly Limit alcohol It is important to maintain a healthy blood pressure and healthy cholesterol numbers   Separate significant issues discussed: Migraines-no recent problems.  Recheck if starting to have problems.  Childhood asthma-no other issues as an adult.  I asked him to get me more information about his brothers history of heart disease.  This sounds significant   Nayson was seen today for new patient (initial visit) and annual exam.  Diagnoses and all orders for this visit:  Encounter for health maintenance examination in adult -     Comprehensive metabolic panel -     CBC -     Lipid panel -     TSH  Screening for lipid disorders -     Lipid panel  Vaccine counseling  Childhood asthma without complication, unspecified asthma severity, unspecified whether persistent  Migraine without status migrainosus, not intractable, unspecified  migraine type  Family history of heart disease     Follow-up pending labs, yearly for physical

## 2020-10-20 LAB — LIPID PANEL
Chol/HDL Ratio: 3.3 ratio (ref 0.0–5.0)
Cholesterol, Total: 264 mg/dL — ABNORMAL HIGH (ref 100–199)
HDL: 81 mg/dL (ref >39)
LDL Chol Calc (NIH): 174 mg/dL — ABNORMAL HIGH (ref 0–99)
Triglycerides: 59 mg/dL (ref 0–149)
VLDL Cholesterol Cal: 9 mg/dL (ref 5–40)

## 2020-10-20 LAB — TSH: TSH: 0.738 u[IU]/mL (ref 0.450–4.500)

## 2020-10-20 LAB — COMPREHENSIVE METABOLIC PANEL
ALT: 25 IU/L (ref 0–44)
AST: 36 IU/L (ref 0–40)
Albumin/Globulin Ratio: 1.9 (ref 1.2–2.2)
Albumin: 5 g/dL (ref 4.1–5.2)
Alkaline Phosphatase: 67 IU/L (ref 44–121)
BUN/Creatinine Ratio: 8 — ABNORMAL LOW (ref 9–20)
BUN: 9 mg/dL (ref 6–20)
Bilirubin Total: 0.9 mg/dL (ref 0.0–1.2)
CO2: 21 mmol/L (ref 20–29)
Calcium: 10 mg/dL (ref 8.7–10.2)
Chloride: 100 mmol/L (ref 96–106)
Creatinine, Ser: 1.11 mg/dL (ref 0.76–1.27)
Globulin, Total: 2.6 g/dL (ref 1.5–4.5)
Glucose: 78 mg/dL (ref 65–99)
Potassium: 5 mmol/L (ref 3.5–5.2)
Sodium: 139 mmol/L (ref 134–144)
Total Protein: 7.6 g/dL (ref 6.0–8.5)
eGFR: 92 mL/min/{1.73_m2} (ref >59)

## 2020-10-20 LAB — CBC
Hematocrit: 42.1 % (ref 37.5–51.0)
Hemoglobin: 14.4 g/dL (ref 13.0–17.7)
MCH: 29.2 pg (ref 26.6–33.0)
MCHC: 34.2 g/dL (ref 31.5–35.7)
MCV: 85 fL (ref 79–97)
Platelets: 276 10*3/uL (ref 150–450)
RBC: 4.93 x10E6/uL (ref 4.14–5.80)
RDW: 13.5 % (ref 11.6–15.4)
WBC: 4.5 10*3/uL (ref 3.4–10.8)

## 2021-07-18 ENCOUNTER — Telehealth: Payer: Self-pay | Admitting: Medical

## 2021-07-18 NOTE — Telephone Encounter (Signed)
Dismissal letter in guarantor snapshot  °

## 2022-09-19 ENCOUNTER — Emergency Department (HOSPITAL_COMMUNITY): Payer: Self-pay

## 2022-09-19 ENCOUNTER — Emergency Department (HOSPITAL_COMMUNITY)
Admission: EM | Admit: 2022-09-19 | Discharge: 2022-09-19 | Disposition: A | Discharge status: Home / Self Care | Payer: Self-pay | Attending: Emergency Medicine | Admitting: Emergency Medicine

## 2022-09-19 ENCOUNTER — Other Ambulatory Visit: Payer: Self-pay

## 2022-09-19 DIAGNOSIS — W2201XA Walked into wall, initial encounter: Secondary | ICD-10-CM | POA: Insufficient documentation

## 2022-09-19 DIAGNOSIS — M79641 Pain in right hand: Secondary | ICD-10-CM | POA: Insufficient documentation

## 2022-09-19 DIAGNOSIS — J45909 Unspecified asthma, uncomplicated: Secondary | ICD-10-CM | POA: Insufficient documentation

## 2022-09-19 MED ORDER — IBUPROFEN 200 MG PO TABS
600.0000 mg | ORAL_TABLET | Freq: Once | ORAL | Status: AC
  Administered 2022-09-19: 600 mg via ORAL
  Filled 2022-09-19: qty 1

## 2022-09-19 MED ORDER — IBUPROFEN 600 MG PO TABS: 600.0000 mg | ORAL_TABLET | Freq: Four times a day (QID) | ORAL | 0 refills | Status: AC | PRN

## 2022-09-19 NOTE — ED Triage Notes (Signed)
Pt. Stated, I hit the wall with rt. Fist. This happened last night .

## 2022-09-19 NOTE — ED Provider Notes (Signed)
[facility] Provider Note   CSN: [id number] Arrival date & time: 09/19/22  1020     History  Chief Complaint  Patient presents with   Hand Injury    Douglas Ellis is a 32 y.o. male.   Hand Injury   32 year old male presents emergency department with complaints of right hand pain.  Patient states that he hit a wall yesterday due to frustration and has had pain since.  Notes pain with movement of right hand.  Denies any sensory deficits or weakness in affected hand.  Presents emergency department due to concerns that he may have broken his hand.  Past medical history significant for migraine, asthma, herpes simplex  Home Medications Prior to Admission medications   Medication Sig Start Date End Date Taking? Authorizing Provider  ibuprofen (ADVIL) 600 MG tablet Take 1 tablet (600 mg total) by mouth every 6 (six) hours as needed. 09/19/22  Yes Wilnette Kales, PA      Allergies    Patient has no known allergies.    Review of Systems   Review of Systems  All other systems reviewed and are negative.   Physical Exam Updated Vital Signs BP (!) 124/94 (BP Location: Left Arm)   Pulse 63   Temp 98.9 F (37.2 C)   Resp 16   Ht '5\' 6"'$  (1.676 m)   Wt 70.3 kg   SpO2 99%   BMI 25.02 kg/m  Physical Exam Vitals and nursing note reviewed.  Constitutional:      General: He is not in acute distress.    Appearance: He is well-developed.  HENT:     Head: Normocephalic and atraumatic.  Eyes:     Conjunctiva/sclera: Conjunctivae normal.  Cardiovascular:     Rate and Rhythm: Normal rate and regular rhythm.     Heart sounds: No murmur heard. Pulmonary:     Effort: Pulmonary effort is normal. No respiratory distress.     Breath sounds: Normal breath sounds.  Abdominal:     Palpations: Abdomen is soft.     Tenderness: There is no abdominal tenderness.  Musculoskeletal:        General: No swelling.     Cervical back: Neck supple.      Comments: Patient with full range of motion of bilateral shoulders, elbows, wrists, digits of upper extremities.  Radial pulses 2+ bilaterally.  No overlying skin abnormalities appreciated.  No anatomical snuffbox tenderness or pain with axial loading of the thumb.  Tender to palpation along third and fourth metacarpal bones.  Patient able to make fist, thumbs up, resist horizontal adduction of digits, oppose thumb, make okay sign, extend wrist without difficulty.  No sensory deficits distally.  No overlying skin abnormalities appreciated.  Skin:    General: Skin is warm and dry.     Capillary Refill: Capillary refill takes less than 2 seconds.  Neurological:     Mental Status: He is alert.  Psychiatric:        Mood and Affect: Mood normal.     ED Results / Procedures / Treatments   Labs (all labs ordered are listed, but only abnormal results are displayed) Labs Reviewed - No data to display  EKG None  Radiology DG Hand Complete Right  Result Date: 09/19/2022 CLINICAL DATA:  Right hand pain since blunt injury today. EXAM: RIGHT HAND - COMPLETE 3 VIEW COMPARISON:  None Available. FINDINGS: There is no evidence of fracture or dislocation. Metallic foreign body in the  soft tissues of the thumb measuring up to 2 mm. IMPRESSION: Negative. Electronically Signed   By: Jorje Guild M.D.   On: 09/19/2022 11:20    Procedures Procedures    Medications Ordered in ED Medications  ibuprofen (ADVIL) tablet 600 mg (has no administration in time range)    ED Course/ Medical Decision Making/ A&P Clinical Course as of 09/19/22 1153  Wed Sep 19, 2022  1139 DG Hand Complete Right [CR]  1153 DG Hand Complete Right [OZ]    Clinical Course User Index [CR] Wilnette Kales, PA [OZ] Luvenia Heller, PA-C                             Medical Decision Making Amount and/or Complexity of Data Reviewed Radiology: ordered.   This patient presents to the ED for concern of right hand pain,  this involves an extensive number of treatment options, and is a complaint that carries with it a high risk of complications and morbidity.  The differential diagnosis includes fracture, transfer sprain, dislocation, neurovascular compromise, laceration, foreign body retainment   Co morbidities that complicate the patient evaluation  See HPI   Additional history obtained:  Additional history obtained from EMR External records from outside source obtained and reviewed including hospital records   Lab Tests:  N/a   Imaging Studies ordered:  I ordered imaging studies including x-ray right hand I independently visualized and interpreted imaging which showed metallic fragment in left thumb but otherwise no acute osseous abnormalities. I agree with the radiologist interpretation   Cardiac Monitoring: / EKG:  The patient was maintained on a cardiac monitor.  I personally viewed and interpreted the cardiac monitored which showed an underlying rhythm of: Sinus rhythm   Consultations Obtained:  N/a   Problem List / ED Course / Critical interventions / Medication management  Right hand pain Reevaluation of the patient showed that the patient stayed the same I have reviewed the patients home medicines and have made adjustments as needed   Social Determinants of Health:  Former cigarette use.  Denies illicit drug use.   Test / Admission - Considered:  Right hand pain Vitals signs within normal range and stable throughout visit. Imaging studies significant for: See above Patient without evidence of acute fracture or dislocation.  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  Patient reassured by overall negative workup.  Given wrist brace to help aid in support for the next few days.  Patient recommended  rest, ice, elevation of affected hand as well as NSAIDs in the form of ibuprofen.  Hand specialist information given for follow-up.  Treatment plan discussed at length with patient and he acknowledged understanding was agreeable to said plan. Worrisome signs and symptoms were discussed with the patient, and the patient acknowledged understanding to return to the ED if noticed. Patient was stable upon discharge.          Final Clinical Impression(s) / ED Diagnoses Final diagnoses:  Pain of right hand    Rx / DC Orders ED Discharge Orders          Ordered    ibuprofen (ADVIL) 600 MG tablet  Every 6 hours PRN        09/19/22 1148  Wilnette Kales, [place] 09/19/22 1153    Carmin Muskrat, MD 09/19/22 [id number]

## 2022-09-19 NOTE — Discharge Instructions (Addendum)
As discussed, workup today overall reassuring.  No evidence of acute fracture or dislocation.  You have metallic foreign body in your right thumb from a thumbtack that is been present there for the past 10 years.  If becomes symptomatic, follow-up with hand specialist outpatient.  See information attached to set up appointment.  In the meantime, rest affected area, ice and take NSAIDs in the form of ibuprofen.  Wear wrist brace as tolerable to help protect affected wrist/hand.  Please do not hesitate to return to emergency department for worrisome signs and symptoms we discussed become apparent.

## 2024-05-22 ENCOUNTER — Emergency Department (HOSPITAL_COMMUNITY)

## 2024-05-22 ENCOUNTER — Other Ambulatory Visit: Payer: Self-pay

## 2024-05-22 ENCOUNTER — Encounter (HOSPITAL_COMMUNITY): Payer: Self-pay

## 2024-05-22 ENCOUNTER — Emergency Department (HOSPITAL_COMMUNITY)
Admission: EM | Admit: 2024-05-22 | Discharge: 2024-05-23 | Discharge status: Against Medical Advice | Attending: Emergency Medicine | Admitting: Emergency Medicine

## 2024-05-22 DIAGNOSIS — Z5321 Procedure and treatment not carried out due to patient leaving prior to being seen by health care provider: Secondary | ICD-10-CM | POA: Diagnosis not present

## 2024-05-22 DIAGNOSIS — W11XXXA Fall on and from ladder, initial encounter: Secondary | ICD-10-CM | POA: Insufficient documentation

## 2024-05-22 DIAGNOSIS — M79632 Pain in left forearm: Secondary | ICD-10-CM | POA: Diagnosis present

## 2024-05-22 NOTE — ED Triage Notes (Addendum)
 Pt states that about 2 hours ago he fell off of a 6 foot ladder and landed on his hands. No LOC. Did not hit head. Pt reports L forearm pain. Pt ambulatory in lobby.

## 2024-05-22 NOTE — ED Notes (Signed)
 Called Pt for room answer x3

## 2024-05-22 NOTE — ED Notes (Signed)
Pt called for room no answer
